# Patient Record
Sex: Female | Born: 1995 | Race: Black or African American | Hispanic: No | Marital: Single | Smoking: Never smoker
Health system: Southern US, Community
[De-identification: ages and names within clinical notes are randomized; demographics above are authoritative.]

## PROBLEM LIST (undated history)

## (undated) DIAGNOSIS — E079 Disorder of thyroid, unspecified: Secondary | ICD-10-CM

---
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
## 2014-01-01 ENCOUNTER — Emergency Department (HOSPITAL_BASED_OUTPATIENT_CLINIC_OR_DEPARTMENT_OTHER)
Admission: EM | Admit: 2014-01-01 | Discharge: 2014-01-01 | Disposition: A | Discharge status: Home / Self Care | Payer: BC Managed Care – PPO | Attending: Emergency Medicine | Admitting: Emergency Medicine

## 2014-01-01 ENCOUNTER — Encounter (HOSPITAL_BASED_OUTPATIENT_CLINIC_OR_DEPARTMENT_OTHER): Payer: Self-pay | Admitting: *Deleted

## 2014-01-01 DIAGNOSIS — R112 Nausea with vomiting, unspecified: Secondary | ICD-10-CM | POA: Insufficient documentation

## 2014-01-01 DIAGNOSIS — G44209 Tension-type headache, unspecified, not intractable: Secondary | ICD-10-CM | POA: Insufficient documentation

## 2014-01-01 DIAGNOSIS — Z3202 Encounter for pregnancy test, result negative: Secondary | ICD-10-CM | POA: Insufficient documentation

## 2014-01-01 DIAGNOSIS — Z79899 Other long term (current) drug therapy: Secondary | ICD-10-CM | POA: Diagnosis not present

## 2014-01-01 DIAGNOSIS — R51 Headache: Secondary | ICD-10-CM | POA: Diagnosis present

## 2014-01-01 LAB — PREGNANCY, URINE: Preg Test, Ur: NEGATIVE

## 2014-01-01 MED ORDER — CYCLOBENZAPRINE HCL 10 MG PO TABS: 10.0000 mg | ORAL_TABLET | Freq: Two times a day (BID) | ORAL | Status: AC | PRN

## 2014-01-01 MED ORDER — KETOROLAC TROMETHAMINE 60 MG/2ML IM SOLN
60.0000 mg | Freq: Once | INTRAMUSCULAR | Status: AC
  Administered 2014-01-01: 60 mg via INTRAMUSCULAR
  Filled 2014-01-01: qty 2

## 2014-01-01 MED ORDER — ONDANSETRON 4 MG PO TBDP
4.0000 mg | ORAL_TABLET | Freq: Once | ORAL | Status: AC
  Administered 2014-01-01: 4 mg via ORAL
  Filled 2014-01-01: qty 1

## 2014-01-01 MED ORDER — ONDANSETRON HCL 4 MG PO TABS: 4.0000 mg | ORAL_TABLET | Freq: Four times a day (QID) | ORAL | Status: AC

## 2014-01-01 NOTE — Discharge Instructions (Signed)
Tension Headache °A tension headache is pain, pressure, or aching felt over the front and sides of the head. Tension headaches often come after stress, feeling worried (anxiety), or feeling sad or down for a while (depressed). °HOME CARE °· Only take medicine as told by your doctor. °· Lie down in a dark, quiet room when you have a headache. °· Keep a journal to find out if certain things bring on headaches. For example, write down: °¨ What you eat and drink. °¨ How much sleep you get. °¨ Any change to your diet or medicines. °· Relax by getting a massage or doing other relaxing activities. °· Put ice or heat packs on the head and neck area as told by your doctor. °· Lessen stress. °· Sit up straight. Do not tighten (tense) your muscles. °· Quit smoking if you smoke. °· Lessen how much alcohol you drink. °· Lessen how much caffeine you drink, or stop drinking caffeine. °· Eat and exercise regularly. °· Get enough sleep. °· Avoid using too much pain medicine. °GET HELP RIGHT AWAY IF:  °· Your headache becomes really bad. °· You have a fever. °· You have a stiff neck. °· You have trouble seeing. °· Your muscles are weak, or you lose muscle control. °· You lose your balance or have trouble walking. °· You feel like you will pass out (faint), or you pass out. °· You have really bad symptoms that are different than your first symptoms. °· You have problems with the medicines given to you by your doctor. °· Your medicines do not work. °· Your headache feels different than the other headaches. °· You feel sick to your stomach (nauseous) or throw up (vomit). °MAKE SURE YOU:  °· Understand these instructions. °· Will watch your condition. °· Will get help right away if you are not doing well or get worse. °Document Released: 03/31/2009 Document Revised: 03/29/2011 Document Reviewed: 12/25/2010 °[employer]® Patient Information ©2015 [employer]. This information is not intended to replace advice given to you by your health  care provider. Make sure you discuss any questions you have with your health care provider. ° °

## 2014-01-01 NOTE — ED Provider Notes (Signed)
CSN: [id number]     Arrival date & time 01/01/14  1827 History  This chart was scribed for Toy CookeyMegan Docherty, MD by Murriel HopperAlec Bankhead, ED Scribe. This patient was seen in room MH02/MH02 and the patient's care was started at 10:29 PM.    Chief Complaint  Patient presents with  . Headache      The history is provided by the patient. No language interpreter was used.    HPI Comments: April Garza is a 18 y.o. female who presents to the Emergency Department complaining of an intermittent dull headache on the back side of her neck with associated nausea and vomiting that began earlier today when she woke up. Pt states that she tried to take Tramadol when she awoke and vomited, which made headache and nausea worse. Pt states that her nausea has been constant and that she has vomited three times today. Pt also states that she has had generalized weakness and has felt "drowsy" all day. Pt also notes that her appetite has been bad today due to the nausea and vomiting. Pt denies vision changes, fevers, abdominal pains, or any recent falls or known head injuries. Pt states that her last menstrual period was on 12/16/13. [facility] and [facility] it regularly.    History reviewed. No pertinent past medical history. History reviewed. No pertinent past surgical history. History reviewed. No pertinent family history. History  Substance Use Topics  . Smoking status: Never Smoker   . Smokeless tobacco: Not on file  . Alcohol Use: No   OB History    No data available     Review of Systems  Constitutional: Positive for appetite change. Negative for chills, diaphoresis and activity change.  HENT: Negative for facial swelling and rhinorrhea.   Eyes: Negative for discharge and visual disturbance.  Respiratory: Negative for chest tightness and shortness of breath.   Cardiovascular: Negative for chest pain, palpitations and leg swelling.  Gastrointestinal: Positive for nausea and vomiting. Negative for abdominal  pain.  Endocrine: Negative for polydipsia and polyuria.  Genitourinary: Negative for dysuria, frequency, difficulty urinating and pelvic pain.  Musculoskeletal: Negative for arthralgias.  Skin: Negative for color change and wound.  Allergic/Immunologic: Negative for immunocompromised state.  Neurological: Positive for weakness and headaches. Negative for facial asymmetry.  Hematological: Does not bruise/bleed easily.  Psychiatric/Behavioral: Negative for confusion and agitation.      Allergies  Review of patient's allergies indicates no known allergies.  Home Medications   Prior to Admission medications   Medication Sig Start Date End Date Taking? Authorizing Provider  traMADol (ULTRAM) 50 MG tablet Take 50 mg by mouth every 6 (six) hours as needed.   Yes Historical Provider, MD  cyclobenzaprine (FLEXERIL) 10 MG tablet Take 1 tablet (10 mg total) by mouth 2 (two) times daily as needed for muscle spasms. 01/01/14   Toy CookeyMegan Docherty, MD  ondansetron (ZOFRAN) 4 MG tablet Take 1 tablet (4 mg total) by mouth every 6 (six) hours. 01/01/14   Toy CookeyMegan Docherty, MD  Phenyleph-CPM-DM-Aspirin (ALKA-SELTZER PLUS COLD & COUGH) 7.08-19-08-325 MG TBEF Take 2 tablets by mouth once.      Historical Provider, MD   BP 109/61 mmHg  Pulse 94  Temp(Src) 98.1 F (36.7 C) (Oral)  Resp 16  SpO2 100%  LMP 12/16/2013 Physical Exam  Constitutional: She is oriented to person, place, and time. She appears well-developed and well-nourished. No distress.  HENT:  Head: Normocephalic.    Mouth/Throat: Oropharynx is clear and moist.  reproduceable muscular tenderness  Eyes: Pupils are equal, round, and reactive to light.  Neck: Neck supple.  Cardiovascular: Normal rate, regular rhythm and normal heart sounds.   Pulmonary/Chest: Effort normal and breath sounds normal. No respiratory distress. She has no wheezes.  Abdominal: Soft. She exhibits no distension. There is no tenderness. There is no rebound and no  guarding.  Musculoskeletal: She exhibits no edema or tenderness.  Neurological: She is alert and oriented to person, place, and time. She displays no atrophy and no tremor. No cranial nerve deficit or sensory deficit. She exhibits normal muscle tone. She displays no seizure activity. Coordination normal. GCS eye subscore is 4. GCS verbal subscore is 5. GCS motor subscore is 6.  Skin: Skin is warm and dry.  Psychiatric: She has a normal mood and affect.  Nursing note and vitals reviewed.   ED Course  Procedures (including critical care time)  DIAGNOSTIC STUDIES: Oxygen Saturation is 100% on RA, normal by my interpretation.    COORDINATION OF CARE: 10:37 PM Discussed treatment plan with pt at bedside and pt agreed to plan.   Labs Review Labs Reviewed  PREGNANCY, URINE    Imaging Review No results found.   EKG Interpretation None      MDM   Final diagnoses:  Tension headache    Pt is a 18 y.o. female with Pmhx as above who presents with 1 day h/a starting on R side of posteior neck radiating to R occiput. Hx of same. H/a normally relieved by tramadol, however, she vomiting today after taking tramadol. Denies fever, neck stiffness, visual changes, numbness or weakness. On PE, VSS, pt in NAD. Cardiopulm, and neuro exam benign. No neck stiffness or dec ROM. Pt feeling improved after IM toradol & ODT zofran. She requests neurology referral givne recurrent h/a. Will d/c home w/ ODT zofran.   April Garza evaluation in the Emergency Department is complete. It has been determined that no acute conditions requiring further emergency intervention are present at this time. The patient/guardian have been advised of the diagnosis and plan. We have discussed signs and symptoms that warrant return to the ED, such as changes or worsening in symptoms, worsening pain, fever, numbess, weakness.     I personally performed the services described in this documentation, which was scribed in my  presence. The recorded information has been reviewed and is accurate.      Toy CookeyMegan Docherty, MD 01/03/14 [phone]

## 2014-01-01 NOTE — ED Notes (Signed)
Pt c/o " tension h/a" x 1 day NO relief with tramadol.

## 2019-12-07 DIAGNOSIS — Z113 Encounter for screening for infections with a predominantly sexual mode of transmission: Secondary | ICD-10-CM | POA: Diagnosis not present

## 2019-12-07 DIAGNOSIS — R69 Illness, unspecified: Secondary | ICD-10-CM | POA: Diagnosis not present

## 2019-12-07 DIAGNOSIS — Z01419 Encounter for gynecological examination (general) (routine) without abnormal findings: Secondary | ICD-10-CM | POA: Diagnosis not present

## 2020-04-24 DIAGNOSIS — R002 Palpitations: Secondary | ICD-10-CM | POA: Diagnosis not present

## 2020-04-24 DIAGNOSIS — R8290 Unspecified abnormal findings in urine: Secondary | ICD-10-CM | POA: Diagnosis not present

## 2020-04-24 DIAGNOSIS — R6884 Jaw pain: Secondary | ICD-10-CM | POA: Diagnosis not present

## 2020-04-24 DIAGNOSIS — R110 Nausea: Secondary | ICD-10-CM | POA: Diagnosis not present

## 2020-04-24 DIAGNOSIS — Z7689 Persons encountering health services in other specified circumstances: Secondary | ICD-10-CM | POA: Diagnosis not present

## 2020-04-24 DIAGNOSIS — R000 Tachycardia, unspecified: Secondary | ICD-10-CM | POA: Diagnosis not present

## 2020-04-24 DIAGNOSIS — R69 Illness, unspecified: Secondary | ICD-10-CM | POA: Diagnosis not present

## 2020-05-13 ENCOUNTER — Encounter (HOSPITAL_BASED_OUTPATIENT_CLINIC_OR_DEPARTMENT_OTHER): Payer: Self-pay

## 2020-05-13 ENCOUNTER — Emergency Department (HOSPITAL_BASED_OUTPATIENT_CLINIC_OR_DEPARTMENT_OTHER): Payer: No Typology Code available for payment source

## 2020-05-13 ENCOUNTER — Other Ambulatory Visit: Payer: Self-pay

## 2020-05-13 ENCOUNTER — Emergency Department (HOSPITAL_BASED_OUTPATIENT_CLINIC_OR_DEPARTMENT_OTHER)
Admission: EM | Admit: 2020-05-13 | Discharge: 2020-05-13 | Disposition: A | Discharge status: Home / Self Care | Payer: No Typology Code available for payment source | Attending: Emergency Medicine | Admitting: Emergency Medicine

## 2020-05-13 DIAGNOSIS — R1110 Vomiting, unspecified: Secondary | ICD-10-CM | POA: Insufficient documentation

## 2020-05-13 DIAGNOSIS — E039 Hypothyroidism, unspecified: Secondary | ICD-10-CM | POA: Insufficient documentation

## 2020-05-13 DIAGNOSIS — Q046 Congenital cerebral cysts: Secondary | ICD-10-CM

## 2020-05-13 DIAGNOSIS — R519 Headache, unspecified: Secondary | ICD-10-CM | POA: Diagnosis not present

## 2020-05-13 DIAGNOSIS — G930 Cerebral cysts: Secondary | ICD-10-CM | POA: Insufficient documentation

## 2020-05-13 DIAGNOSIS — R000 Tachycardia, unspecified: Secondary | ICD-10-CM | POA: Insufficient documentation

## 2020-05-13 HISTORY — DX: Disorder of thyroid, unspecified: E07.9

## 2020-05-13 LAB — CBC WITH DIFFERENTIAL/PLATELET
Abs Immature Granulocytes: 0.02 10*3/uL (ref 0.00–0.07)
Basophils Absolute: 0 10*3/uL (ref 0.0–0.1)
Basophils Relative: 0 %
Eosinophils Absolute: 0 10*3/uL (ref 0.0–0.5)
Eosinophils Relative: 0 %
HCT: 37.1 % (ref 36.0–46.0)
Hemoglobin: 12.4 g/dL (ref 12.0–15.0)
Immature Granulocytes: 0 %
Lymphocytes Relative: 26 %
Lymphs Abs: 2.5 10*3/uL (ref 0.7–4.0)
MCH: 27.6 pg (ref 26.0–34.0)
MCHC: 33.4 g/dL (ref 30.0–36.0)
MCV: 82.4 fL (ref 80.0–100.0)
Monocytes Absolute: 0.7 10*3/uL (ref 0.1–1.0)
Monocytes Relative: 8 %
Neutro Abs: 6.4 10*3/uL (ref 1.7–7.7)
Neutrophils Relative %: 66 %
Platelets: 372 10*3/uL (ref 150–400)
RBC: 4.5 MIL/uL (ref 3.87–5.11)
RDW: 12.6 % (ref 11.5–15.5)
WBC: 9.7 10*3/uL (ref 4.0–10.5)
nRBC: 0 % (ref 0.0–0.2)

## 2020-05-13 LAB — URINALYSIS, ROUTINE W REFLEX MICROSCOPIC
Bilirubin Urine: NEGATIVE
Glucose, UA: NEGATIVE mg/dL
Hgb urine dipstick: NEGATIVE
Ketones, ur: NEGATIVE mg/dL
Leukocytes,Ua: NEGATIVE
Nitrite: NEGATIVE
Protein, ur: NEGATIVE mg/dL
Specific Gravity, Urine: 1.01 (ref 1.005–1.030)
pH: 8 (ref 5.0–8.0)

## 2020-05-13 LAB — COMPREHENSIVE METABOLIC PANEL
ALT: 61 U/L — ABNORMAL HIGH (ref 0–44)
AST: 39 U/L (ref 15–41)
Albumin: 3.9 g/dL (ref 3.5–5.0)
Alkaline Phosphatase: 64 U/L (ref 38–126)
Anion gap: 13 (ref 5–15)
BUN: 11 mg/dL (ref 6–20)
CO2: 22 mmol/L (ref 22–32)
Calcium: 10.3 mg/dL (ref 8.9–10.3)
Chloride: 101 mmol/L (ref 98–111)
Creatinine, Ser: 0.44 mg/dL (ref 0.44–1.00)
GFR, Estimated: >60 mL/min (ref >60)
Glucose, Bld: 114 mg/dL — ABNORMAL HIGH (ref 70–99)
Potassium: 3.5 mmol/L (ref 3.5–5.1)
Sodium: 136 mmol/L (ref 135–145)
Total Bilirubin: 0.8 mg/dL (ref 0.3–1.2)
Total Protein: 6.9 g/dL (ref 6.5–8.1)

## 2020-05-13 LAB — RAPID URINE DRUG SCREEN, HOSP PERFORMED
Amphetamines: NOT DETECTED
Barbiturates: NOT DETECTED
Benzodiazepines: NOT DETECTED
Cocaine: NOT DETECTED
Opiates: NOT DETECTED
Tetrahydrocannabinol: NOT DETECTED

## 2020-05-13 LAB — HCG, QUANTITATIVE, PREGNANCY: hCG, Beta Chain, Quant, S: <1 m[IU]/mL (ref <5)

## 2020-05-13 MED ORDER — ONDANSETRON HCL 4 MG/2ML IJ SOLN
4.0000 mg | Freq: Once | INTRAMUSCULAR | Status: AC
  Administered 2020-05-13: 4 mg via INTRAVENOUS
  Filled 2020-05-13: qty 2

## 2020-05-13 MED ORDER — SODIUM CHLORIDE 0.9 % IV BOLUS
1000.0000 mL | Freq: Once | INTRAVENOUS | Status: AC
  Administered 2020-05-13: 1000 mL via INTRAVENOUS

## 2020-05-13 NOTE — ED Notes (Signed)
States she feels much better, still unable to provide urine spec, ED PA aware. PO fluid challenge initiated. HR primarily ranges from 100-102/ min, intermittently noted to be noted at 120/min

## 2020-05-13 NOTE — ED Notes (Signed)
Patient transported to CT 

## 2020-05-13 NOTE — ED Triage Notes (Addendum)
Pt c/o HA, nausea, dizziness started ~8am-NAD-to triage in w/c-pt added she was recently notified she has elevated TSH and is being referred to an endocrinologist

## 2020-05-13 NOTE — ED Provider Notes (Signed)
[facility] EMERGENCY DEPARTMENT Provider Note   CSN: [id number] Arrival date & time: 05/13/20  1211     History Chief Complaint  Patient presents with  . Headache    April Garza is a 25 y.o. female.  25 year old female brought in by mom for headache with vomiting onset today at 8AM. Patient was just recently diagnosed with hyperthyroid, awaiting endocrine work up. Reports frontal headache with lightening flashing in her right eye at onset, no current visual disturbance. Headache is described as similar to prior headaches which she typically experiences the week before her menstrual cycle however states LMP was 4/19 and has never had aura or visual disturbance with prior headaches. No changes in speech or gait. Mom also reports patient has had 10-15lbs weight loss in the past month, loose stools x 4 yesterday, gets short of breath after climbing a flight of stairs.         Past Medical History:  Diagnosis Date  . Thyroid disease     There are no problems to display for this patient.   History reviewed. No pertinent surgical history.   OB History   No obstetric history on file.     No family history on file.  Social History   Tobacco Use  . Smoking status: Never Smoker  . Smokeless tobacco: Never Used  Vaping Use  . Vaping Use: Never used  Substance Use Topics  . Alcohol use: No  . Drug use: No    Home Medications Prior to Admission medications   Medication Sig Start Date End Date Taking? Authorizing Provider  cyclobenzaprine (FLEXERIL) 10 MG tablet Take 1 tablet (10 mg total) by mouth 2 (two) times daily as needed for muscle spasms. 01/01/14   Toy Cookey, MD  ondansetron (ZOFRAN) 4 MG tablet Take 1 tablet (4 mg total) by mouth every 6 (six) hours. 01/01/14   Toy Cookey, MD  Phenyleph-CPM-DM-Aspirin 7.08-19-08-325 MG TBEF Take 2 tablets by mouth once.    [provider]  traMADol (ULTRAM) 50 MG tablet Take 50 mg by mouth every 6  (six) hours as needed.    [provider]    Allergies    Patient has no known allergies.  Review of Systems   Review of Systems  Constitutional: Positive for unexpected weight change. Negative for chills and fever.  Eyes: Positive for visual disturbance. Negative for photophobia and pain.  Respiratory: Negative for shortness of breath.   Cardiovascular: Negative for chest pain.  Gastrointestinal: Positive for diarrhea, nausea and vomiting. Negative for abdominal pain, blood in stool and constipation.  Genitourinary: Negative for dysuria.  Musculoskeletal: Negative for arthralgias and myalgias.  Skin: Negative for rash and wound.  Allergic/Immunologic: Negative for immunocompromised state.  Neurological: Positive for headaches. Negative for speech difficulty and numbness.  Hematological: Negative for adenopathy.  Psychiatric/Behavioral: Negative for confusion.  All other systems reviewed and are negative.   Physical Exam Updated Vital Signs BP 120/73   Pulse (!) 116   Temp 98.3 F (36.8 C)   Resp 18   Ht 5\' 4"  (1.626 m)   Wt 59.4 kg   LMP 05/06/2020   SpO2 100%   BMI 22.49 kg/m   Physical Exam Vitals and nursing note reviewed.  Constitutional:      General: She is not in acute distress.    Appearance: She is well-developed. She is not diaphoretic.  HENT:     Head: Normocephalic and atraumatic.     Mouth/Throat:  Mouth: Mucous membranes are moist.     Pharynx: Oropharynx is clear.  Eyes:     General: No visual field deficit.    Extraocular Movements: Extraocular movements intact.     Pupils: Pupils are equal, round, and reactive to light.  Cardiovascular:     Rate and Rhythm: Regular rhythm. Tachycardia present.     Heart sounds: Normal heart sounds.  Pulmonary:     Effort: Pulmonary effort is normal.     Breath sounds: Normal breath sounds.  Abdominal:     Palpations: Abdomen is soft.     Tenderness: There is no abdominal tenderness.   Musculoskeletal:     Cervical back: Neck supple.  Skin:    General: Skin is warm and dry.  Neurological:     Mental Status: She is alert and oriented to person, place, and time.     GCS: GCS eye subscore is 4. GCS verbal subscore is 5. GCS motor subscore is 6.     Cranial Nerves: No cranial nerve deficit, dysarthria or facial asymmetry.     Sensory: No sensory deficit.     Motor: No weakness.     Deep Tendon Reflexes: Babinski sign absent on the right side. Babinski sign absent on the left side.  Psychiatric:        Mood and Affect: Mood normal.        Speech: Speech normal.        Behavior: Behavior normal.     ED Results / Procedures / Treatments   Labs (all labs ordered are listed, but only abnormal results are displayed) Labs Reviewed  COMPREHENSIVE METABOLIC PANEL - Abnormal; Notable for the following components:      Result Value   Glucose, Bld 114 (*)    ALT 61 (*)    All other components within normal limits  URINALYSIS, ROUTINE W REFLEX MICROSCOPIC  CBC WITH DIFFERENTIAL/PLATELET  HCG, QUANTITATIVE, PREGNANCY  RAPID URINE DRUG SCREEN, HOSP PERFORMED    EKG None  Radiology CT Head Wo Contrast  Result Date: 05/13/2020 CLINICAL DATA:  New or worsening headache in a 25 year old female. EXAM: CT HEAD WITHOUT CONTRAST TECHNIQUE: Contiguous axial images were obtained from the base of the skull through the vertex without intravenous contrast. COMPARISON:  None. FINDINGS: Brain: No evidence of acute infarction, hemorrhage, hydrocephalus, extra-axial collection or mass lesion/mass effect. Hyperdense area in the midline near the septum pellucidum along the superior aspect of the third ventricle, without dilation of the ventricles. This area measures 3 mm on image 15 of series 2. Vascular: No hyperdense vessel or unexpected calcification. Skull: Normal. Negative for fracture or focal lesion. Sinuses/Orbits: Visualized paranasal sinuses and orbits are unremarkable. Other: None  IMPRESSION: Rounded increased density at the superior third ventricle compatible with small colloid cyst, not associated with hydrocephalus. Colloid cysts can be associated with headache but can also be an incidental finding. No acute intracranial process. Electronically Signed   By: Donzetta Kohut M.D.   On: 05/13/2020 14:00    Procedures Procedures   Medications Ordered in ED Medications  sodium chloride 0.9 % bolus 1,000 mL (0 mLs Intravenous Stopped 05/13/20 1510)  ondansetron (ZOFRAN) injection 4 mg (4 mg Intravenous Given 05/13/20 1306)    ED Course  I have reviewed the triage vital signs and the nursing notes.  Pertinent labs & imaging results that were available during my care of the patient were reviewed by me and considered in my medical decision making (see chart for details).  Clinical  Course as of 05/13/20 1544  Tue May 13, 2020  2830 25 year old female with recent diagnosis of hyper thyroidism, currently scheduled to follow-up with endocrinology later this week presents with complaint of headache, not typical for her.  Neuro exam is unremarkable. CT head shows 3 mm likely colloid cyst.  Discussed results with patient, referred to neurosurgery for follow-up. Labs reassuring including CBC, CMP.  Mildly elevated ALT on labs today, prior labs earlier this month with elevated AST and ALT, labs today improving. She has been mildly tachycardic throughout her ED stay similar to recent urgent care visit and likely related to her untreated hyperthyroidism. Patient was given Zofran and IV fluids, states she is feeling significantly better, is laughing and conversing on the phone and states that she is feeling ready for discharge at this time. [LM]  1543 UA collected due to concern for bacteriuria on urgent care labs, UDS with concern for cannabinoid hyperemesis however doubt at this time due to complete recovery after IV fluids and Zofran. [LM]    Clinical Course User Index [LM] Alden Hipp   MDM Rules/Calculators/A&P                          Final Clinical Impression(s) / ED Diagnoses Final diagnoses:  Acute nonintractable headache, unspecified headache type  Colloid cyst of third ventricle [facility])    Rx / DC Orders ED Discharge Orders    None       Jeannie Fend, PA-C 05/13/20 1544    Long, Arlyss Repress, MD 05/16/20 [phone]

## 2020-05-13 NOTE — Discharge Instructions (Signed)
Follow-up with neurosurgery as discussed, referral given, call to schedule appointment. Return to the emergency room for new or worsening symptoms otherwise follow-up with endocrinology as scheduled this week.

## 2020-05-15 DIAGNOSIS — M542 Cervicalgia: Secondary | ICD-10-CM | POA: Diagnosis not present

## 2020-05-15 DIAGNOSIS — L723 Sebaceous cyst: Secondary | ICD-10-CM | POA: Diagnosis not present

## 2020-05-15 DIAGNOSIS — Q046 Congenital cerebral cysts: Secondary | ICD-10-CM | POA: Diagnosis not present

## 2020-05-16 DIAGNOSIS — R7989 Other specified abnormal findings of blood chemistry: Secondary | ICD-10-CM | POA: Diagnosis not present

## 2020-05-16 DIAGNOSIS — E0590 Thyrotoxicosis, unspecified without thyrotoxic crisis or storm: Secondary | ICD-10-CM | POA: Diagnosis not present

## 2020-05-16 DIAGNOSIS — R002 Palpitations: Secondary | ICD-10-CM | POA: Diagnosis not present

## 2020-06-11 DIAGNOSIS — R69 Illness, unspecified: Secondary | ICD-10-CM | POA: Diagnosis not present

## 2020-06-23 DIAGNOSIS — R69 Illness, unspecified: Secondary | ICD-10-CM | POA: Diagnosis not present

## 2020-06-30 DIAGNOSIS — R69 Illness, unspecified: Secondary | ICD-10-CM | POA: Diagnosis not present

## 2020-09-18 DIAGNOSIS — E0590 Thyrotoxicosis, unspecified without thyrotoxic crisis or storm: Secondary | ICD-10-CM | POA: Diagnosis not present

## 2020-12-26 DIAGNOSIS — Z01419 Encounter for gynecological examination (general) (routine) without abnormal findings: Secondary | ICD-10-CM | POA: Diagnosis not present

## 2020-12-26 DIAGNOSIS — R69 Illness, unspecified: Secondary | ICD-10-CM | POA: Diagnosis not present

## 2021-01-27 DIAGNOSIS — E0590 Thyrotoxicosis, unspecified without thyrotoxic crisis or storm: Secondary | ICD-10-CM | POA: Diagnosis not present

## 2021-03-30 DIAGNOSIS — D487 Neoplasm of uncertain behavior of other specified sites: Secondary | ICD-10-CM | POA: Diagnosis not present

## 2021-03-30 DIAGNOSIS — H02831 Dermatochalasis of right upper eyelid: Secondary | ICD-10-CM | POA: Diagnosis not present

## 2021-03-30 DIAGNOSIS — E0590 Thyrotoxicosis, unspecified without thyrotoxic crisis or storm: Secondary | ICD-10-CM | POA: Diagnosis not present

## 2021-03-30 DIAGNOSIS — H05223 Edema of bilateral orbit: Secondary | ICD-10-CM | POA: Diagnosis not present

## 2021-03-30 DIAGNOSIS — H02834 Dermatochalasis of left upper eyelid: Secondary | ICD-10-CM | POA: Diagnosis not present

## 2021-05-04 DIAGNOSIS — D487 Neoplasm of uncertain behavior of other specified sites: Secondary | ICD-10-CM | POA: Diagnosis not present

## 2021-05-04 DIAGNOSIS — H02834 Dermatochalasis of left upper eyelid: Secondary | ICD-10-CM | POA: Diagnosis not present

## 2021-05-04 DIAGNOSIS — E0590 Thyrotoxicosis, unspecified without thyrotoxic crisis or storm: Secondary | ICD-10-CM | POA: Diagnosis not present

## 2021-05-04 DIAGNOSIS — H02831 Dermatochalasis of right upper eyelid: Secondary | ICD-10-CM | POA: Diagnosis not present

## 2021-05-04 DIAGNOSIS — H05223 Edema of bilateral orbit: Secondary | ICD-10-CM | POA: Diagnosis not present

## 2021-05-24 DIAGNOSIS — R030 Elevated blood-pressure reading, without diagnosis of hypertension: Secondary | ICD-10-CM | POA: Diagnosis not present

## 2021-05-24 DIAGNOSIS — E663 Overweight: Secondary | ICD-10-CM | POA: Diagnosis not present

## 2021-05-24 DIAGNOSIS — J029 Acute pharyngitis, unspecified: Secondary | ICD-10-CM | POA: Diagnosis not present

## 2021-05-24 DIAGNOSIS — Z6826 Body mass index (BMI) 26.0-26.9, adult: Secondary | ICD-10-CM | POA: Diagnosis not present

## 2021-08-30 DIAGNOSIS — K120 Recurrent oral aphthae: Secondary | ICD-10-CM | POA: Diagnosis not present

## 2021-08-30 DIAGNOSIS — K140 Glossitis: Secondary | ICD-10-CM | POA: Diagnosis not present

## 2021-11-12 DIAGNOSIS — E0590 Thyrotoxicosis, unspecified without thyrotoxic crisis or storm: Secondary | ICD-10-CM | POA: Diagnosis not present

## 2021-12-23 DIAGNOSIS — H5213 Myopia, bilateral: Secondary | ICD-10-CM | POA: Diagnosis not present

## 2021-12-23 DIAGNOSIS — H52209 Unspecified astigmatism, unspecified eye: Secondary | ICD-10-CM | POA: Diagnosis not present

## 2022-01-01 DIAGNOSIS — H5213 Myopia, bilateral: Secondary | ICD-10-CM | POA: Diagnosis not present

## 2022-03-13 ENCOUNTER — Other Ambulatory Visit: Payer: Self-pay

## 2022-03-13 ENCOUNTER — Emergency Department (HOSPITAL_BASED_OUTPATIENT_CLINIC_OR_DEPARTMENT_OTHER): Payer: 59

## 2022-03-13 ENCOUNTER — Emergency Department (HOSPITAL_BASED_OUTPATIENT_CLINIC_OR_DEPARTMENT_OTHER)
Admission: EM | Admit: 2022-03-13 | Discharge: 2022-03-13 | Disposition: A | Discharge status: Home / Self Care | Payer: 59 | Attending: Emergency Medicine | Admitting: Emergency Medicine

## 2022-03-13 ENCOUNTER — Encounter (HOSPITAL_BASED_OUTPATIENT_CLINIC_OR_DEPARTMENT_OTHER): Payer: Self-pay | Admitting: Emergency Medicine

## 2022-03-13 DIAGNOSIS — U071 COVID-19: Secondary | ICD-10-CM | POA: Insufficient documentation

## 2022-03-13 DIAGNOSIS — M545 Low back pain, unspecified: Secondary | ICD-10-CM | POA: Diagnosis not present

## 2022-03-13 DIAGNOSIS — M546 Pain in thoracic spine: Secondary | ICD-10-CM

## 2022-03-13 DIAGNOSIS — R079 Chest pain, unspecified: Secondary | ICD-10-CM | POA: Diagnosis not present

## 2022-03-13 DIAGNOSIS — J069 Acute upper respiratory infection, unspecified: Secondary | ICD-10-CM | POA: Diagnosis not present

## 2022-03-13 DIAGNOSIS — R0981 Nasal congestion: Secondary | ICD-10-CM | POA: Diagnosis present

## 2022-03-13 LAB — RESP PANEL BY RT-PCR (RSV, FLU A&B, COVID)  RVPGX2
Influenza A by PCR: NEGATIVE
Influenza B by PCR: NEGATIVE
Resp Syncytial Virus by PCR: NEGATIVE
SARS Coronavirus 2 by RT PCR: POSITIVE — AB

## 2022-03-13 NOTE — ED Provider Notes (Signed)
[place] EMERGENCY DE[facility]MENT [facility] Provider Note   CSN: [id number] Arrival date & time: 03/13/22  1237     History  Chief Complaint  Patient presents with   Back Pain    April Garza is a 27 y.o. female.  Patient here with some viral type symptoms the last few days.  Congestion and cough.  Concerned she might have pneumonia.  Denies any chest pain or shortness of breath nausea vomiting diarrhea.  Denies any trauma.  No weakness numbness or tingling.  She is on birth control.  No recent surgery or travel.  The history is provided by the patient.       Home Medications Prior to Admission medications   Medication Sig Start Date End Date Taking? Authorizing Provider  cyclobenzaprine (FLEXERIL) 10 MG tablet Take 1 tablet (10 mg total) by mouth 2 (two) times daily as needed for muscle spasms. 01/01/14   Ernestina Patches, MD  ondansetron (ZOFRAN) 4 MG tablet Take 1 tablet (4 mg total) by mouth every 6 (six) hours. 01/01/14   Ernestina Patches, MD  Phenyleph-CPM-DM-Aspirin 7.08-19-08-325 MG TBEF Take 2 tablets by mouth once.    [provider]  traMADol (ULTRAM) 50 MG tablet Take 50 mg by mouth every 6 (six) hours as needed.    [provider]      Allergies    Patient has no known allergies.    Review of Systems   Review of Systems  Physical Exam Updated Vital Signs BP (!) 150/109 (BP Location: Left Arm)   Pulse 93   Temp 98.3 F (36.8 C) (Oral)   Resp 19   Ht '5\' 4"'$  (1.626 m)   Wt 68 kg   LMP 02/21/2022   SpO2 100%   BMI 25.75 kg/m  Physical Exam Vitals and nursing note reviewed.  Constitutional:      General: She is not in acute distress.    Appearance: She is well-developed. She is not ill-appearing.  HENT:     Head: Normocephalic and atraumatic.     Nose: Nose normal.     Mouth/Throat:     Mouth: Mucous membranes are moist.  Eyes:     Extraocular Movements: Extraocular movements intact.     Conjunctiva/sclera:  Conjunctivae normal.     Pupils: Pupils are equal, round, and reactive to light.  Cardiovascular:     Rate and Rhythm: Normal rate and regular rhythm.     Pulses: Normal pulses.     Heart sounds: Normal heart sounds. No murmur heard. Pulmonary:     Effort: Pulmonary effort is normal. No respiratory distress.     Breath sounds: Normal breath sounds.  Abdominal:     Palpations: Abdomen is soft.     Tenderness: There is no abdominal tenderness.  Musculoskeletal:        General: Normal range of motion.     Cervical back: Normal range of motion and neck supple.  Skin:    General: Skin is warm and dry.     Capillary Refill: Capillary refill takes less than 2 seconds.  Neurological:     General: No focal deficit present.     Mental Status: She is alert.     ED Results / Procedures / Treatments   Labs (all labs ordered are listed, but only abnormal results are displayed) Labs Reviewed  RESP PANEL BY RT-PCR (RSV, FLU A&B, COVID)  [id number]    EKG None  Radiology DG Chest Portable 1 View  Result Date:  03/13/2022 CLINICAL DATA:  RIGHT-sided back pain since Wednesday. EXAM: PORTABLE CHEST 1 VIEW COMPARISON:  Chest x-ray dated 07/13/2009 FINDINGS: Heart size and mediastinal contours are within normal limits. Lungs are clear. No pleural effusion or pneumothorax is seen. Osseous structures about the chest are unremarkable. IMPRESSION: No active disease. No evidence of pneumonia or pulmonary edema. Electronically Signed   By: Franki Cabot M.D.   On: 03/13/2022 13:42    Procedures Procedures    Medications Ordered in ED Medications - No data to display  ED Course/ Medical Decision Making/ A&P                             Medical Decision Making Amount and/or Complexity of Data Reviewed Radiology: ordered.   April Garza is here with upper back pain, viral symptoms.  Normal vitals.  No fever.  Differential diagnosis likely viral process versus MSK process versus less likely  pneumonia.  She is PERC negative and no concern for PE.  Doubt ACS.  Very well-appearing.  Clear breath sounds.  Doubt reactive airway process.  Will check chest x-ray and COVID and flu testing.  Chest x-ray per my review and interpretation shows no evidence of pneumonia or pneumothorax.  Overall suspect resolving viral process.  Discharged in good condition.  Understands return precautions.  This chart was dictated using voice recognition software.  Despite best efforts to proofread,  errors can occur which can change the documentation meaning.         Final Clinical Impression(s) / ED Diagnoses Final diagnoses:  Acute thoracic back pain, unspecified back pain laterality  Viral upper respiratory tract infection    Rx / DC Orders ED Discharge Orders     None         Lennice Sites, DO 03/13/22 1359

## 2022-03-13 NOTE — ED Triage Notes (Signed)
Pt c/o mid to upper right sided back pain onset Wednesday. Pt denies injury.

## 2022-11-29 ENCOUNTER — Ambulatory Visit
Admission: EM | Admit: 2022-11-29 | Discharge: 2022-11-29 | Disposition: A | Discharge status: Home / Self Care | Payer: Medicaid Other

## 2022-11-29 ENCOUNTER — Other Ambulatory Visit: Payer: Self-pay

## 2022-11-29 ENCOUNTER — Encounter: Payer: Self-pay | Admitting: Emergency Medicine

## 2022-11-29 DIAGNOSIS — T192XXA Foreign body in vulva and vagina, initial encounter: Secondary | ICD-10-CM | POA: Diagnosis not present

## 2022-11-29 NOTE — Discharge Instructions (Addendum)
Advised patient no foreign body was noted on exam today.  Advised if symptoms worsen and/or unresolved please follow-up with GYN or here for further evaluation.

## 2022-11-29 NOTE — ED Provider Notes (Signed)
[facility]    CSN: [id number] Arrival date & time: 11/29/22  1328      History   Chief Complaint Chief Complaint  Patient presents with   Foreign Body in Vagina    HPI April Garza is a 27 y.o. female.   HPI Pleasant 27 year old-year-old female presents with foreign body in her vagina.  Patient is concerned that she may have left a tampon in 2 days ago.  PMH significant for thyroid disease.  Past Medical History:  Diagnosis Date   Thyroid disease     There are no problems to display for this patient.   History reviewed. No pertinent surgical history.  OB History   No obstetric history on file.      Home Medications    Prior to Admission medications   Medication Sig Start Date End Date Taking? Authorizing Provider  cyclobenzaprine (FLEXERIL) 10 MG tablet Take 1 tablet (10 mg total) by mouth 2 (two) times daily as needed for muscle spasms. 01/01/14   Toy Cookey, MD  ondansetron (ZOFRAN) 4 MG tablet Take 1 tablet (4 mg total) by mouth every 6 (six) hours. 01/01/14   Toy Cookey, MD  Phenyleph-CPM-DM-Aspirin 7.08-19-08-325 MG TBEF Take 2 tablets by mouth once.    [provider]  traMADol (ULTRAM) 50 MG tablet Take 50 mg by mouth every 6 (six) hours as needed.    [provider]    Family History Family History  Problem Relation Age of Onset   Hypertension Mother     Social History Social History   Tobacco Use   Smoking status: Never   Smokeless tobacco: Never  Vaping Use   Vaping status: Never Used  Substance Use Topics   Alcohol use: No   Drug use: No     Allergies   Patient has no known allergies.   Review of Systems Review of Systems   Physical Exam Triage Vital Signs ED Triage Vitals  Encounter Vitals Group     BP 11/29/22 1355 120/81     Systolic BP Percentile --      Diastolic BP Percentile --      Pulse Rate 11/29/22 1355 (!) 104     Resp 11/29/22 1355 18     Temp 11/29/22 1355 98.3 F  (36.8 C)     Temp Source 11/29/22 1355 Oral     SpO2 11/29/22 1355 98 %     Weight --      Height --      Head Circumference --      Peak Flow --      Pain Score 11/29/22 1354 0     Pain Loc --      Pain Education --      Exclude from Growth Chart --    No data found.  Updated Vital Signs BP 120/81 (BP Location: Left Arm)   Pulse (!) 104   Temp 98.3 F (36.8 C) (Oral)   Resp 18   LMP 11/26/2022   SpO2 98%   Visual Acuity Right Eye Distance:   Left Eye Distance:   Bilateral Distance:    Right Eye Near:   Left Eye Near:    Bilateral Near:     Physical Exam Vitals and nursing note reviewed.  Constitutional:      Appearance: Normal appearance. She is normal weight.  HENT:     Head: Normocephalic and atraumatic.     Mouth/Throat:     Mouth: Mucous membranes are moist.  Pharynx: Oropharynx is clear.  Eyes:     Extraocular Movements: Extraocular movements intact.     Conjunctiva/sclera: Conjunctivae normal.     Pupils: Pupils are equal, round, and reactive to light.  Cardiovascular:     Rate and Rhythm: Normal rate and regular rhythm.     Pulses: Normal pulses.     Heart sounds: Normal heart sounds.  Pulmonary:     Effort: Pulmonary effort is normal.     Breath sounds: Normal breath sounds. No wheezing, rhonchi or rales.  Musculoskeletal:        General: Normal range of motion.     Cervical back: Normal range of motion and neck supple.  Skin:    General: Skin is warm and dry.  Neurological:     General: No focal deficit present.     Mental Status: She is alert and oriented to person, place, and time. Mental status is at baseline.  Psychiatric:        Mood and Affect: Mood normal.        Behavior: Behavior normal.        Thought Content: Thought content normal.      UC Treatments / Results  Labs (all labs ordered are listed, but only abnormal results are displayed) Labs Reviewed - No data to display  EKG   Radiology No results  found.  Procedures Procedures (including critical care time)  Medications Ordered in UC Medications - No data to display  Initial Impression / Assessment and Plan / UC Course  I have reviewed the triage vital signs and the nursing notes.  Pertinent labs & imaging results that were available during my care of the patient were reviewed by me and considered in my medical decision making (see chart for details).     MDM: 1.  Foreign body in vagina, initial encounter-no foreign body noted on vaginal exam today chaperone present during this exam. Advised patient no foreign body was noted on exam today.  Advised if symptoms worsen and/or unresolved please follow-up with GYN or here for further evaluation.  Patient discharged home, hemodynamically stable. Final Clinical Impressions(s) / UC Diagnoses   Final diagnoses:  Foreign body in vagina, initial encounter     Discharge Instructions      Advised patient no foreign body was noted on exam today.  Advised if symptoms worsen and/or unresolved please follow-up with GYN or here for further evaluation.     ED Prescriptions   None    PDMP not reviewed this encounter.   Trevor Iha, FNP 11/29/22 1446

## 2022-11-29 NOTE — ED Triage Notes (Addendum)
Patient presents to [facility] for ossible removal of tampon, possibly placed Saturday.  Patient states she is not discharging blood as mch as she would expect for her period.  Some abdominal pressure yesterday, no pain or discharge at this time

## 2023-01-06 DIAGNOSIS — Z113 Encounter for screening for infections with a predominantly sexual mode of transmission: Secondary | ICD-10-CM | POA: Diagnosis not present

## 2023-01-06 DIAGNOSIS — Z0000 Encounter for general adult medical examination without abnormal findings: Secondary | ICD-10-CM | POA: Diagnosis not present

## 2023-01-06 DIAGNOSIS — E0590 Thyrotoxicosis, unspecified without thyrotoxic crisis or storm: Secondary | ICD-10-CM | POA: Diagnosis not present

## 2023-01-06 DIAGNOSIS — Z13220 Encounter for screening for lipoid disorders: Secondary | ICD-10-CM | POA: Diagnosis not present

## 2023-01-06 DIAGNOSIS — L700 Acne vulgaris: Secondary | ICD-10-CM | POA: Diagnosis not present

## 2023-01-15 ENCOUNTER — Ambulatory Visit: Payer: Self-pay

## 2023-02-09 DIAGNOSIS — A530 Latent syphilis, unspecified as early or late: Secondary | ICD-10-CM | POA: Diagnosis not present

## 2023-02-09 DIAGNOSIS — R000 Tachycardia, unspecified: Secondary | ICD-10-CM | POA: Diagnosis not present

## 2023-02-09 DIAGNOSIS — R002 Palpitations: Secondary | ICD-10-CM | POA: Diagnosis not present

## 2023-02-10 ENCOUNTER — Other Ambulatory Visit: Payer: Self-pay

## 2023-02-10 ENCOUNTER — Emergency Department (HOSPITAL_BASED_OUTPATIENT_CLINIC_OR_DEPARTMENT_OTHER)
Admission: EM | Admit: 2023-02-10 | Discharge: 2023-02-10 | Disposition: A | Discharge status: Home / Self Care | Payer: Medicaid Other | Attending: Emergency Medicine | Admitting: Emergency Medicine

## 2023-02-10 ENCOUNTER — Emergency Department (HOSPITAL_BASED_OUTPATIENT_CLINIC_OR_DEPARTMENT_OTHER): Payer: Medicaid Other

## 2023-02-10 DIAGNOSIS — R000 Tachycardia, unspecified: Secondary | ICD-10-CM | POA: Diagnosis not present

## 2023-02-10 DIAGNOSIS — R519 Headache, unspecified: Secondary | ICD-10-CM | POA: Diagnosis present

## 2023-02-10 DIAGNOSIS — E0590 Thyrotoxicosis, unspecified without thyrotoxic crisis or storm: Secondary | ICD-10-CM | POA: Diagnosis not present

## 2023-02-10 LAB — BASIC METABOLIC PANEL
Anion gap: 7 (ref 5–15)
BUN: 17 mg/dL (ref 6–20)
CO2: 22 mmol/L (ref 22–32)
Calcium: 9.6 mg/dL (ref 8.9–10.3)
Chloride: 105 mmol/L (ref 98–111)
Creatinine, Ser: 0.6 mg/dL (ref 0.44–1.00)
GFR, Estimated: >60 mL/min (ref >60)
Glucose, Bld: 94 mg/dL (ref 70–99)
Potassium: 4.8 mmol/L (ref 3.5–5.1)
Sodium: 134 mmol/L — ABNORMAL LOW (ref 135–145)

## 2023-02-10 LAB — CBC
HCT: 36.2 % (ref 36.0–46.0)
Hemoglobin: 12.3 g/dL (ref 12.0–15.0)
MCH: 29 pg (ref 26.0–34.0)
MCHC: 34 g/dL (ref 30.0–36.0)
MCV: 85.4 fL (ref 80.0–100.0)
Platelets: 321 10*3/uL (ref 150–400)
RBC: 4.24 MIL/uL (ref 3.87–5.11)
RDW: 12 % (ref 11.5–15.5)
WBC: 6.9 10*3/uL (ref 4.0–10.5)
nRBC: 0 % (ref 0.0–0.2)

## 2023-02-10 LAB — HCG, SERUM, QUALITATIVE: Preg, Serum: NEGATIVE

## 2023-02-10 LAB — TROPONIN I (HIGH SENSITIVITY): Troponin I (High Sensitivity): 3 ng/L (ref <18)

## 2023-02-10 MED ORDER — METHIMAZOLE 10 MG PO TABS
20.0000 mg | ORAL_TABLET | ORAL | Status: AC
  Administered 2023-02-10: 20 mg via ORAL
  Filled 2023-02-10: qty 2

## 2023-02-10 MED ORDER — SODIUM CHLORIDE 0.9 % IV BOLUS
1000.0000 mL | Freq: Once | INTRAVENOUS | Status: AC
  Administered 2023-02-10: 1000 mL via INTRAVENOUS

## 2023-02-10 MED ORDER — HYDROCORTISONE SOD SUC (PF) 100 MG IJ SOLR
100.0000 mg | Freq: Once | INTRAMUSCULAR | Status: AC
  Administered 2023-02-10: 100 mg via INTRAVENOUS
  Filled 2023-02-10: qty 2

## 2023-02-10 MED ORDER — KETOROLAC TROMETHAMINE 15 MG/ML IJ SOLN
15.0000 mg | Freq: Once | INTRAMUSCULAR | Status: AC
  Administered 2023-02-10: 15 mg via INTRAVENOUS
  Filled 2023-02-10: qty 1

## 2023-02-10 MED ORDER — METOPROLOL TARTRATE 25 MG PO TABS
12.5000 mg | ORAL_TABLET | Freq: Once | ORAL | Status: AC
  Administered 2023-02-10: 12.5 mg via ORAL
  Filled 2023-02-10: qty 1

## 2023-02-10 MED ORDER — METHIMAZOLE 10 MG PO TABS: ORAL_TABLET | ORAL | 0 refills | Status: AC

## 2023-02-10 NOTE — ED Provider Notes (Signed)
[place] [facility] AT [facility] Provider Note   CSN: [id number] Arrival date & time: 02/10/23  7829     History  Chief Complaint  Patient presents with   Headache    April Garza is a 28 y.o. female.  Patient is a 28 yo female previously dx with hyperthyroidism in April 2022 and took medication until January 2023 after the recommendation of Dr. Allena Katz her endocrinologist due to stable thyroid levels. Patient states she has done well over the past year, however, she started having palpitations, insomnia, weight loss, increased appetite, intermittent loose bowel movement, tremor, and headaches over the past week. Palpitations started 4 days ago. Hr on arrival 119-130. Her pcp completed outpatient labs that demonstrate a TSH  of 0.05, T4 of 5.1, and T3 of >20.  Labs collected on 02/09/2023 and are easily seen on [facility] via [facility] everywhere system. Pt was given prn metoprolol by pcp while awaiting lab results. Pt states she took Metoprolol 25 mg aprox 30 prior to arrival.   Patient has ultrasound scheduled in 1 week to reassess thyroid. Patient has endocrinology appointment for April 26, 2023.  Patient states PCP agreed to follow patient's studies and manage medications until her endocrinology appointment.  The history is provided by the patient. No language interpreter was used.  Headache Associated symptoms: no abdominal pain, no back pain, no cough, no ear pain, no eye pain, no fever, no seizures, no sore throat and no vomiting        Home Medications Prior to Admission medications   Medication Sig Start Date End Date Taking? Authorizing Provider  methimazole (TAPAZOLE) 10 MG tablet Take Methimazole 20 mg orally today at [facility]. Take another 20 mg orally before bed tonight. Starting tomorrow, take 20mg  in the morning and at bedtime (twice a day) and continue this for one week. Have your primary care doctor recheck your thyroid levels. Your dose will like  be reduced to Methimazole 20 mg once daily. 02/10/23  Yes Edwin Dada P, DO  Phenyleph-CPM-DM-Aspirin 7.08-19-08-325 MG TBEF Take 2 tablets by mouth once.   Yes [provider]  cyclobenzaprine (FLEXERIL) 10 MG tablet Take 1 tablet (10 mg total) by mouth 2 (two) times daily as needed for muscle spasms. 01/01/14   Toy Cookey, MD  ondansetron (ZOFRAN) 4 MG tablet Take 1 tablet (4 mg total) by mouth every 6 (six) hours. 01/01/14   Toy Cookey, MD  traMADol (ULTRAM) 50 MG tablet Take 50 mg by mouth every 6 (six) hours as needed.    [provider]      Allergies    Patient has no known allergies.    Review of Systems   Review of Systems  Constitutional:  Positive for unexpected weight change. Negative for chills and fever.  HENT:  Negative for ear pain and sore throat.   Eyes:  Negative for pain and visual disturbance.  Respiratory:  Negative for cough and shortness of breath.   Cardiovascular:  Positive for palpitations. Negative for chest pain.  Gastrointestinal:  Negative for abdominal pain and vomiting.  Genitourinary:  Negative for dysuria and hematuria.  Musculoskeletal:  Negative for arthralgias and back pain.  Skin:  Negative for color change and rash.  Neurological:  Positive for headaches. Negative for seizures and syncope.  Psychiatric/Behavioral:  Positive for sleep disturbance.   All other systems reviewed and are negative.   Physical Exam Updated Vital Signs BP 114/75   Pulse (!) 114   Temp  99.3 F (37.4 C)   Resp (!) 23   Ht 5\' 4"  (1.626 m)   Wt 64.9 kg   LMP 01/24/2023   SpO2 98%   BMI 24.55 kg/m  Physical Exam Vitals and nursing note reviewed.  Constitutional:      General: She is not in acute distress.    Appearance: She is well-developed.  HENT:     Head: Normocephalic and atraumatic.  Eyes:     Conjunctiva/sclera: Conjunctivae normal.  Cardiovascular:     Rate and Rhythm: Regular rhythm. Tachycardia present.     Heart sounds:  No murmur heard. Pulmonary:     Effort: Pulmonary effort is normal. No respiratory distress.     Breath sounds: Normal breath sounds.  Abdominal:     Palpations: Abdomen is soft.     Tenderness: There is no abdominal tenderness.  Musculoskeletal:        General: No swelling.     Cervical back: Neck supple.  Skin:    General: Skin is warm and dry.     Capillary Refill: Capillary refill takes less than 2 seconds.  Neurological:     Mental Status: She is alert.  Psychiatric:        Mood and Affect: Mood normal.     ED Results / Procedures / Treatments   Labs (all labs ordered are listed, but only abnormal results are displayed) Labs Reviewed  BASIC METABOLIC PANEL - Abnormal; Notable for the following components:      Result Value   Sodium 134 (*)    All other components within normal limits  CBC  HCG, SERUM, QUALITATIVE  TROPONIN I (HIGH SENSITIVITY)    EKG EKG Interpretation Date/Time:  Thursday February 10 2023 07:31:40 EST Ventricular Rate:  120 PR Interval:  141 QRS Duration:  87 QT Interval:  324 QTC Calculation: 458 R Axis:   77  Text Interpretation: Sinus tachycardia Borderline Q waves in inferior leads Confirmed by Alona Bene [id number]) on 02/11/2023 10:09:37 AM  Radiology No results found.  Procedures Procedures    Medications Ordered in ED Medications  methimazole (TAPAZOLE) tablet 20 mg (20 mg Oral Given 02/10/23 0835)  sodium chloride 0.9 % bolus 1,000 mL (0 mLs Intravenous Stopped 02/10/23 0951)  hydrocortisone sodium succinate (SOLU-CORTEF) 100 MG injection 100 mg (100 mg Intravenous Given 02/10/23 0837)  ketorolac (TORADOL) 15 MG/ML injection 15 mg (15 mg Intravenous Given 02/10/23 0836)  metoprolol tartrate (LOPRESSOR) tablet 12.5 mg (12.5 mg Oral Given 02/10/23 2952)    ED Course/ Medical Decision Making/ A&P                                 Medical Decision Making Amount and/or Complexity of Data Reviewed Labs: ordered. Radiology:  ordered.  Risk Prescription drug management.   28 year old female with hyperthyroidism not currently on her medications presenting with palpitations, weight loss, increased appetite, loose bowel movements, headache, and insomnia.  Patient's primary care physician did outpatient labs demonstrate hyperthyroidism.  Her EKG is otherwise stable with sinus tachycardia.  Her chest x-ray is stable.  Her labs are stable.  Will recommend treating patient with methimazole.  No evidence of storm at this time.  Patient is awake, alert, pleasant, and interactive.  She is afebrile.  Without diarrhea, nausea, vomiting, or abdominal pain, and he has no edema.  Diagnostic criteria for thyroid storm score is 24 temperature of 99.3 and heart rate between 120 and  129.  A score of 45 or higher suggest thyroid storm.  Score below 25 makes thyroid storm unlikely.  I spoke with our pharmacy team in regards to dosing given patient's levels.  They recommend if the patient is not in thyroid storm to start methimazole 20 mg with a repeat dose in 6 hours and before bedtime.  Then they recommend taking it twice a day for the next week and following up for repeat lab results and evaluation by PCP for further management and dosing changes.  They also recommended that we start hydrocortisone 100 mg once.  Occasions prescribed for patient in emergency department.  She was also given IV fluids and Toradol for headache.  Reevaluation of patient demonstrates improvement of headache that is now described as mild.  Heart rate is down to 118.  She states she still having the palpitations and discomfort from tachycardia.  Metoprolol 25 mg taken approximately 3 hours ago.  Will give a 12.5 mg oral dose at this time.  Blood pressure stable.    Patient given option to be transferred for endocrine evaluation versus discharge home with prescription sent to pharmacy.  Requesting discharge at this time.  She does have as needed metoprolol if needed and  she agrees to return to emergency department immediately for any worsening concerning signs or symptoms.  Patient in no distress and overall condition improved here in the ED. Detailed discussions were had with the patient regarding current findings, and need for close f/u with PCP or on call doctor. The patient has been instructed to return immediately if the symptoms worsen in any way for re-evaluation. Patient verbalized understanding and is in agreement with current care plan. All questions answered prior to discharge.         Final Clinical Impression(s) / ED Diagnoses Final diagnoses:  Hyperthyroidism    Rx / DC Orders ED Discharge Orders          Ordered    methimazole (TAPAZOLE) 10 MG tablet        02/10/23 1105              Edwin Dada [place], [place] 02/14/23 2334

## 2023-02-10 NOTE — Discharge Instructions (Addendum)
Scription sent to [facility].  Take Methimazole 20 mg orally today at [facility]. Take another 20 mg orally before bed tonight. Starting tomorrow, take 20mg  in the morning and at bedtime (twice a day) and continue this for one week. Have your primary care doctor recheck your thyroid levels. Your dose will like be reduced to Methimazole 20 mg once daily.   Follow-up in 1 week with your primary care physician for repeat lab drawl and medication dosing adjustments.  Keep your appointment with your endocrinologist for April 26, 2023.  Return to emergency department if palpitations and elevated heart rate are not controlled with as needed metoprolol that was given by her primary care physician.

## 2023-02-10 NOTE — ED Triage Notes (Signed)
Pt has a headache and that her heart rate has been elevated. Seen her PCP yesterday and was instructed to come to ED if her heart rate was above 125 bpm.  States that she does get tired easy and has SOB at times. Denies at this time.

## 2023-02-14 DIAGNOSIS — E049 Nontoxic goiter, unspecified: Secondary | ICD-10-CM | POA: Diagnosis not present

## 2023-02-14 DIAGNOSIS — E0590 Thyrotoxicosis, unspecified without thyrotoxic crisis or storm: Secondary | ICD-10-CM | POA: Diagnosis not present

## 2023-02-17 DIAGNOSIS — E049 Nontoxic goiter, unspecified: Secondary | ICD-10-CM | POA: Diagnosis not present

## 2023-02-17 DIAGNOSIS — R000 Tachycardia, unspecified: Secondary | ICD-10-CM | POA: Diagnosis not present

## 2023-02-28 DIAGNOSIS — Z0000 Encounter for general adult medical examination without abnormal findings: Secondary | ICD-10-CM | POA: Diagnosis not present

## 2023-03-17 DIAGNOSIS — L700 Acne vulgaris: Secondary | ICD-10-CM | POA: Diagnosis not present

## 2023-03-28 DIAGNOSIS — R21 Rash and other nonspecific skin eruption: Secondary | ICD-10-CM | POA: Diagnosis not present

## 2023-03-28 DIAGNOSIS — E7800 Pure hypercholesterolemia, unspecified: Secondary | ICD-10-CM | POA: Diagnosis not present

## 2023-03-28 DIAGNOSIS — K149 Disease of tongue, unspecified: Secondary | ICD-10-CM | POA: Diagnosis not present

## 2023-04-22 DIAGNOSIS — E0590 Thyrotoxicosis, unspecified without thyrotoxic crisis or storm: Secondary | ICD-10-CM | POA: Diagnosis not present

## 2023-04-22 DIAGNOSIS — E049 Nontoxic goiter, unspecified: Secondary | ICD-10-CM | POA: Diagnosis not present

## 2023-09-26 DIAGNOSIS — Z6825 Body mass index (BMI) 25.0-25.9, adult: Secondary | ICD-10-CM | POA: Diagnosis not present

## 2023-09-26 DIAGNOSIS — Z3200 Encounter for pregnancy test, result unknown: Secondary | ICD-10-CM | POA: Diagnosis not present

## 2023-09-26 DIAGNOSIS — Z1159 Encounter for screening for other viral diseases: Secondary | ICD-10-CM | POA: Diagnosis not present

## 2023-09-26 DIAGNOSIS — Z1331 Encounter for screening for depression: Secondary | ICD-10-CM | POA: Diagnosis not present

## 2023-09-26 DIAGNOSIS — E0590 Thyrotoxicosis, unspecified without thyrotoxic crisis or storm: Secondary | ICD-10-CM | POA: Diagnosis not present

## 2023-09-26 DIAGNOSIS — R030 Elevated blood-pressure reading, without diagnosis of hypertension: Secondary | ICD-10-CM | POA: Diagnosis not present

## 2023-09-26 DIAGNOSIS — Z0000 Encounter for general adult medical examination without abnormal findings: Secondary | ICD-10-CM | POA: Diagnosis not present

## 2023-09-26 DIAGNOSIS — Z1339 Encounter for screening examination for other mental health and behavioral disorders: Secondary | ICD-10-CM | POA: Diagnosis not present

## 2023-09-26 DIAGNOSIS — R5383 Other fatigue: Secondary | ICD-10-CM | POA: Diagnosis not present

## 2023-09-26 DIAGNOSIS — G43109 Migraine with aura, not intractable, without status migrainosus: Secondary | ICD-10-CM | POA: Diagnosis not present

## 2023-09-26 DIAGNOSIS — R0602 Shortness of breath: Secondary | ICD-10-CM | POA: Diagnosis not present

## 2023-09-26 DIAGNOSIS — E559 Vitamin D deficiency, unspecified: Secondary | ICD-10-CM | POA: Diagnosis not present
